# Patient Record
Sex: Female | Born: 1985 | Race: Black or African American | Hispanic: No | Marital: Single | Smoking: Former smoker
Health system: Southern US, Community
[De-identification: ages and names within clinical notes are randomized; demographics above are authoritative.]

## PROBLEM LIST (undated history)

## (undated) DIAGNOSIS — I509 Heart failure, unspecified: Secondary | ICD-10-CM

---
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
## 2009-07-10 ENCOUNTER — Emergency Department (HOSPITAL_COMMUNITY): Admission: EM | Admit: 2009-07-10 | Discharge: 2009-07-10 | Payer: Self-pay | Admitting: Family Medicine

## 2009-07-11 ENCOUNTER — Observation Stay (HOSPITAL_COMMUNITY): Admission: EM | Admit: 2009-07-11 | Discharge: 2009-07-12 | Payer: Self-pay | Admitting: Emergency Medicine

## 2009-07-11 ENCOUNTER — Encounter (INDEPENDENT_AMBULATORY_CARE_PROVIDER_SITE_OTHER): Payer: Self-pay | Admitting: Internal Medicine

## 2009-07-18 ENCOUNTER — Ambulatory Visit: Payer: Self-pay | Admitting: Internal Medicine

## 2009-07-18 DIAGNOSIS — J069 Acute upper respiratory infection, unspecified: Secondary | ICD-10-CM | POA: Insufficient documentation

## 2009-07-18 DIAGNOSIS — F17200 Nicotine dependence, unspecified, uncomplicated: Secondary | ICD-10-CM | POA: Insufficient documentation

## 2009-07-18 DIAGNOSIS — R9431 Abnormal electrocardiogram [ECG] [EKG]: Secondary | ICD-10-CM | POA: Insufficient documentation

## 2009-07-18 DIAGNOSIS — G4730 Sleep apnea, unspecified: Secondary | ICD-10-CM | POA: Insufficient documentation

## 2009-07-18 DIAGNOSIS — D649 Anemia, unspecified: Secondary | ICD-10-CM | POA: Insufficient documentation

## 2009-07-19 ENCOUNTER — Encounter (INDEPENDENT_AMBULATORY_CARE_PROVIDER_SITE_OTHER): Payer: Self-pay | Admitting: *Deleted

## 2009-07-29 ENCOUNTER — Ambulatory Visit: Payer: Self-pay | Admitting: Internal Medicine

## 2009-08-05 ENCOUNTER — Encounter: Payer: Self-pay | Admitting: Internal Medicine

## 2009-08-05 ENCOUNTER — Telehealth: Payer: Self-pay | Admitting: Internal Medicine

## 2009-08-26 ENCOUNTER — Encounter: Payer: Self-pay | Admitting: Internal Medicine

## 2010-03-02 ENCOUNTER — Emergency Department (HOSPITAL_COMMUNITY): Admission: EM | Admit: 2010-03-02 | Discharge: 2010-03-02 | Payer: Self-pay | Admitting: Family Medicine

## 2010-03-14 ENCOUNTER — Emergency Department (HOSPITAL_COMMUNITY): Admission: EM | Admit: 2010-03-14 | Discharge: 2010-03-14 | Payer: Self-pay | Admitting: Family Medicine

## 2010-06-10 NOTE — Letter (Signed)
Summary: Work note/[facility] [employer]  Work note/[facility] [employer]   Imported By: Lester [place] 08/29/2009 08:02:47  _____________________________________________________________________  External Attachment:    Type:   Image     Comment:   External Document

## 2010-06-10 NOTE — Assessment & Plan Note (Signed)
Summary: NEW/ [employer] /[employer]  #   Vital Signs:  Patient profile:   25 year old female Menstrual status:  regular LMP:     07/16/2009 Height:      69 inches Weight:      255 pounds BMI:     37.79 O2 Sat:      98 % on Room air Temp:     97.1 degrees F oral Pulse rate:   70 / minute Pulse rhythm:   regular Resp:     16 per minute BP sitting:   104 / 68  (left arm) Cuff size:   large  Vitals Entered By: Rock Nephew CMA (July 18, 2009 4:01 PM)  O2 Flow:  Room air  Primary Care Provider:  Etta Grandchild MD   History of Present Illness: New to me she needs a f/up on possible pericarditis. She developed URI symptoms one week ago and had some SOB so she was sent to the ER where her EKG had non-specific ST/T wave changes and the chest xray showed an enlarged cardiac silhouette. She is feeling much better but still has a mild cough.  She has iron-deficiency anemia from heavy periods.  Her bed partner complains of heavy snoring and apneic episodes.  Preventive Screening-Counseling & Management  Alcohol-Tobacco     Alcohol drinks/day: 1     Alcohol type: all     >5/day in last 3 mos: no     Alcohol Counseling: not indicated; use of alcohol is not excessive or problematic     Feels need to cut down: no     Feels annoyed by complaints: no     Feels guilty re: drinking: no     Needs 'eye opener' in am: no     Smoking Status: current     Smoking Cessation Counseling: yes     Smoke Cessation Stage: precontemplative  Caffeine-Diet-Exercise     Does Patient Exercise: no  Hep-HIV-STD-Contraception     Hepatitis Risk: no risk noted     HIV Risk: no risk noted     STD Risk: no risk noted      Sexual History:  currently monogamous.        Drug Use:  no.        Blood Transfusions:  no.    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   Resp:  Complains of cough, excessive snoring, hypersomnolence, and morning headaches; denies chest discomfort, chest pain with inspiration, coughing up blood, pleuritic, shortness of breath, sputum productive, and wheezing. Heme:  Denies abnormal bruising, bleeding, enlarge lymph nodes, fevers, pallor, and skin discoloration.  Physical Exam  General:  alert, well-developed, well-nourished, well-hydrated, appropriate dress, normal appearance, healthy-appearing, cooperative to examination, good hygiene, and overweight-appearing.   Head:  normocephalic, atraumatic, no abnormalities observed, and  no abnormalities palpated.   Eyes:  vision grossly intact, pupils equal, pupils round, and pupils reactive to light.   Ears:  R ear normal and L ear normal.   Nose:  External nasal examination shows no deformity or inflammation. Nasal mucosa are pink and moist without lesions or exudates. Mouth:  Oral mucosa and oropharynx without lesions or exudates.  Teeth in good repair. Neck:  supple, full ROM, no masses, no thyromegaly, no JVD, normal carotid upstroke, no carotid bruits, no cervical lymphadenopathy, and no neck tenderness.   Lungs:  normal respiratory effort, no intercostal retractions, no accessory muscle use, normal breath sounds, no dullness, no fremitus, no  crackles, and R wheezes.   Heart:  normal rate, regular rhythm, no murmur, no gallop, no rub, and no JVD.   Abdomen:  soft, non-tender, normal bowel sounds, no distention, no masses, no guarding, no rigidity, no rebound tenderness, no hepatomegaly, and no splenomegaly.   Msk:  No deformity or scoliosis noted of thoracic or lumbar spine.   Pulses:  R and L carotid,radial,femoral,dorsalis pedis and posterior tibial pulses are full and equal bilaterally Extremities:  No clubbing, cyanosis, edema, or deformity noted with normal full range of motion of all joints.   Neurologic:  No cranial nerve deficits noted. Station and gait are normal. Plantar reflexes are down-going bilaterally. DTRs are symmetrical throughout. Sensory, motor and coordinative functions appear intact. Skin:  Intact without suspicious lesions or rashes Cervical Nodes:  no anterior cervical adenopathy and no posterior cervical adenopathy.   Axillary Nodes:  no R axillary adenopathy and no L axillary adenopathy.   Psych:  Cognition and judgment appear intact. Alert and cooperative with normal attention span and concentration. No apparent delusions, illusions, hallucinations Additional Exam:  EKG shows sinus bradycardia with normal pr segments and normal st/t waves   Impression & Recommendations:  Problem # 1:  ANEMIA-NOS (ICD-285.9)  Problem # 2:  TOBACCO USE (ICD-305.1) Assessment: Unchanged  Encouraged smoking cessation and discussed different methods for smoking cessation.   Problem # 3:  URI (ICD-465.9) Assessment: New it sounds like she has an inflammatory component so will treat with systemic steroids Orders: Admin of Therapeutic Inj  intramuscular or subcutaneous (16109) Depo- Medrol 40mg  (J1030) Depo- Medrol 80mg  (J1040)  Problem # 4:  SLEEP APNEA (ICD-780.57) Assessment: New  Orders: Sleep Disorder Referral (Sleep Disorder)  Problem # 5:  ELECTROCARDIOGRAM, ABNORMAL (ICD-794.31) Assessment:  Improved  Orders: EKG w/ Interpretation (93000)  Patient Instructions: 1)  Please schedule a follow-up appointment in 1 month. 2)  Get plenty of rest, drink lots of clear liquids, and use Tylenol or Ibuprofen for fever and comfort. Return in 7-10 days if you're not better:sooner if you're feeling worse. 3)  Tobacco is very bad for your health and your loved ones! You Should stop smoking!. 4)  Stop Smoking Tips: Choose a Quit date. Cut down before the Quit date. decide what you will do as a substitute when you feel the urge to smoke(gum,toothpick,exercise). 5)  It is important that you exercise regularly at least 20 minutes 5 times a week. If you develop chest pain, have severe difficulty breathing, or feel very tired , stop exercising immediately and seek medical attention. 6)  You need to lose weight. Consider a lower calorie diet and regular exercise.    Medication Administration  Injection # 1:    Medication: Depo- Medrol 80mg     Diagnosis: URI (ICD-465.9)    Route: IM    Site: R deltoid  Exp Date: 01/2012    Lot #: Franchot Heidelberg    Mfr: pfizer    Patient tolerated injection without complications    Given by: Rock Nephew CMA (July 18, 2009 4:02 PM)  Injection # 2:    Medication: Depo- Medrol 40mg     Diagnosis: URI (ICD-465.9)    Route: IM    Site: R deltoid    Exp Date: 01/2012    Lot #: Franchot Heidelberg    Mfr: pfizer    Patient tolerated injection without complications    Given by: Rock Nephew CMA (July 18, 2009 4:02 PM)  Orders Added: 1)  Admin of Therapeutic Inj  intramuscular or subcutaneous [96372] 2)  Depo- Medrol 40mg  [J1030] 3)  Depo- Medrol 80mg  [J1040] 4)  EKG w/ Interpretation [93000] 5)  Sleep Disorder Referral [Sleep Disorder] 6)  New Patient Level IV [04540]

## 2010-06-10 NOTE — Progress Notes (Signed)
Summary: WORK NOTE  Phone Note From Other Clinic   Caller: Bjorn Loser [phone] Summary of Call: [facility] services is requiring a note stating pt is able to return w/o restrictions on March 14th. FAX [phone] ATTN Rhonda Purvis - HR coordinator. Initial call taken by: Lamar Sprinkles, CMA,  August 05, 2009 11:41 AM  Follow-up for Phone Call        ok Follow-up by: Etta Grandchild MD,  August 05, 2009 11:45 AM  Additional Follow-up for Phone Call Additional follow up Details #1::        letter faxed to the provided number Attn Rhonda Additional Follow-up by: Rock Nephew CMA,  August 05, 2009 3:13 PM

## 2010-06-10 NOTE — Letter (Signed)
Summary: [facility] Consult Scheduled Letter  [facility]  [address] [place], [place] [address]   Phone: [phone]  Fax: [phone]      07/19/2009 MRN: [id number]  Traci Livingston [address] [place], [place]  [address]    Dear [name],      We have scheduled an appointment for you. At the recommendation of Dr.Jones,we have scheduled you a consult with ([facility])Dr.Alva on Friday April 1,2011 at 10:00Am- Arrival 9:45am .Their phone number is [phone]. If this appointment day and time is not convenient for you, please feel free to call the office of the doctor you are being referred to at the number listed above and reschedule the appointment.  [facility] 2nd floor [address] [place] [address]    **Please give 24Hr notice if you need to cancel/Reschedule to avoid a $50.00 fee. also [employer] card  and any co-pay due at time of visit ****  Thank you,  Patient Care Coordinator [facility]

## 2010-06-10 NOTE — Letter (Signed)
Summary: Out of Work  [facility]  [address] [place], [place] [address]   Phone: [phone]  Fax: [phone]    August 05, 2009   Employee:  [employer] [place]    To Whom It May Concern:   The above patient is permitted to return to work with no restrictions on July 22, 2009.    If you need additional information, please feel free to contact our office.         Sincerely,    Etta Grandchild MD

## 2010-08-03 LAB — POCT I-STAT, CHEM 8
BUN: 10 mg/dL (ref 6–23)
Chloride: 104 mEq/L (ref 96–112)
Creatinine, Ser: 1.1 mg/dL (ref 0.4–1.2)
Glucose, Bld: 99 mg/dL (ref 70–99)
Sodium: 136 mEq/L (ref 135–145)
TCO2: 23 mmol/L (ref 0–100)

## 2010-08-03 LAB — URINALYSIS, ROUTINE W REFLEX MICROSCOPIC
Bilirubin Urine: NEGATIVE
Glucose, UA: NEGATIVE mg/dL
Hgb urine dipstick: NEGATIVE
Ketones, ur: NEGATIVE mg/dL
Nitrite: NEGATIVE
Urobilinogen, UA: 1 mg/dL (ref 0.0–1.0)

## 2010-08-03 LAB — CBC
MCHC: 33.5 g/dL (ref 30.0–36.0)
MCHC: 33.8 g/dL (ref 30.0–36.0)
MCV: 75.9 fL — ABNORMAL LOW (ref 78.0–100.0)
MCV: 76.4 fL — ABNORMAL LOW (ref 78.0–100.0)
Platelets: 206 10*3/uL (ref 150–400)
Platelets: 224 10*3/uL (ref 150–400)
RBC: 4.37 MIL/uL (ref 3.87–5.11)
RDW: 14.6 % (ref 11.5–15.5)
RDW: 14.8 % (ref 11.5–15.5)

## 2010-08-03 LAB — FERRITIN: Ferritin: 29 ng/mL (ref 10–291)

## 2010-08-03 LAB — DIFFERENTIAL
Lymphs Abs: 0.4 10*3/uL — ABNORMAL LOW (ref 0.7–4.0)
Monocytes Absolute: 0.7 10*3/uL (ref 0.1–1.0)
Monocytes Relative: 10 % (ref 3–12)
Neutro Abs: 5.7 10*3/uL (ref 1.7–7.7)

## 2010-08-03 LAB — CULTURE, BLOOD (ROUTINE X 2): Culture: NO GROWTH

## 2010-08-03 LAB — COMPREHENSIVE METABOLIC PANEL
ALT: 20 U/L (ref 0–35)
Albumin: 3.1 g/dL — ABNORMAL LOW (ref 3.5–5.2)
Alkaline Phosphatase: 69 U/L (ref 39–117)
Chloride: 105 mEq/L (ref 96–112)
Potassium: 3.6 mEq/L (ref 3.5–5.1)
Sodium: 133 mEq/L — ABNORMAL LOW (ref 135–145)
Total Protein: 6.7 g/dL (ref 6.0–8.3)

## 2010-08-03 LAB — BASIC METABOLIC PANEL
BUN: 9 mg/dL (ref 6–23)
CO2: 27 mEq/L (ref 19–32)
Chloride: 107 mEq/L (ref 96–112)
GFR calc Af Amer: >60 mL/min (ref >60)
Glucose, Bld: 92 mg/dL (ref 70–99)
Potassium: 3.5 mEq/L (ref 3.5–5.1)
Sodium: 140 mEq/L (ref 135–145)

## 2010-08-03 LAB — RETICULOCYTES
RBC.: 4.66 MIL/uL (ref 3.87–5.11)
Retic Count, Absolute: 55.9 10*3/uL (ref 19.0–186.0)

## 2010-08-03 LAB — CARDIAC PANEL(CRET KIN+CKTOT+MB+TROPI)
Relative Index: 0.4 (ref 0.0–2.5)
Total CK: 196 U/L — ABNORMAL HIGH (ref 7–177)
Troponin I: 0.01 ng/mL (ref 0.00–0.06)

## 2010-08-03 LAB — TSH: TSH: 0.199 u[IU]/mL — ABNORMAL LOW (ref 0.350–4.500)

## 2010-08-03 LAB — CK TOTAL AND CKMB (NOT AT ARMC)
CK, MB: 1 ng/mL (ref 0.3–4.0)
Relative Index: 0.5 (ref 0.0–2.5)

## 2010-08-03 LAB — LIPID PANEL
LDL Cholesterol: 95 mg/dL (ref 0–99)
Triglycerides: 37 mg/dL (ref <150)

## 2010-08-03 LAB — FOLATE: Folate: 15.1 ng/mL

## 2010-08-03 LAB — BRAIN NATRIURETIC PEPTIDE: Pro B Natriuretic peptide (BNP): <30 pg/mL (ref 0.0–100.0)

## 2010-08-03 LAB — POCT CARDIAC MARKERS: Myoglobin, poc: 62.3 ng/mL (ref 12–200)

## 2010-08-03 LAB — TROPONIN I: Troponin I: 0.02 ng/mL (ref 0.00–0.06)

## 2010-08-03 LAB — IRON AND TIBC
Iron: 14 ug/dL — ABNORMAL LOW (ref 42–135)
Saturation Ratios: 4 % — ABNORMAL LOW (ref 20–55)

## 2010-08-03 LAB — C-REACTIVE PROTEIN: CRP: 5.4 mg/dL — ABNORMAL HIGH (ref <0.6)

## 2010-08-03 LAB — URINE MICROSCOPIC-ADD ON

## 2010-08-31 ENCOUNTER — Emergency Department (HOSPITAL_COMMUNITY)
Admission: EM | Admit: 2010-08-31 | Discharge: 2010-08-31 | Disposition: A | Discharge status: Home / Self Care | Payer: No Typology Code available for payment source | Attending: Emergency Medicine | Admitting: Emergency Medicine

## 2010-08-31 DIAGNOSIS — M542 Cervicalgia: Secondary | ICD-10-CM | POA: Insufficient documentation

## 2010-08-31 DIAGNOSIS — M25519 Pain in unspecified shoulder: Secondary | ICD-10-CM | POA: Insufficient documentation

## 2010-08-31 DIAGNOSIS — IMO0001 Reserved for inherently not codable concepts without codable children: Secondary | ICD-10-CM | POA: Insufficient documentation

## 2010-08-31 DIAGNOSIS — M546 Pain in thoracic spine: Secondary | ICD-10-CM | POA: Insufficient documentation

## 2010-09-26 NOTE — Op Note (Signed)
NAME:  Traci Livingston, Traci Livingston              ACCOUNT NO.:  [id number]   MEDICAL RECORD NO.:  [id number]          PATIENT TYPE:  AMB   LOCATION:  [facility]                          FACILITY:  [facility]   PHYSICIAN:  Gerald L. 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   PROCEDURE:  Bilateral breast reductions using the inferior pedicle  technique.   ANESTHESIA:  General.   DESCRIPTION OF PROCEDURE:  Preoperatively, the patient was set up and drawn  for the inferior pedicle reduction mammoplasty, remarking the nipple areolar  complexes from over 34 cm back up to 20 cm from the suprasternal notch.  She  then underwent general anesthesia intubated orally.  Prep was done to the  chest and breast areas in routine fashion using Betadine soap and solution  and walled off with sterile towels and drapes so as to make a sterile field.  The areas were injected locally, the parenchyma with 0.25% Xylocaine with  epinephrine 1:400,000 concentration, a total of 150 mL per side.  The wounds  were scored with #15 blades and then the skin over the inferior pedicle was  deepithelialized with #20 blades.  Medial and lateral fatty dermal pedicles  were excised down to underlying fascia of the pectoralis major.  Laterally  more accessory breast tissue was removed until the axillary regions as well  as the serratus anterior to the latissimus dorsi areas.  After proper  hemostasis, new areas of the keyhole was debulked.  After this the flaps  were transposed and stayed with 3-0 Prolene.  Subcutaneous closure was done  with 3-0 Monocryl x2 layers and then running subcuticular stitches of 3-0  Monocryl and 5-0 Monocryl throughout the inverted T.  We  performed the same  procedure on both sides.  The right side was slightly larger.  The wounds  were cleansed.  Steri-Strips and soft dressings were applied to all of the  areas including Xeroform, 4x4's, ABD's, and Hypafix tape.  At the end of the  procedure, the nipple areolar complexes were examined with good suppleness  and good blood supply.  She was then taken to the recovery room in excellent  condition.  Estimated blood loss was less than 100 mL.  Complications were  none.      Yaakov Guthrie. Shon Hough, M.D.  Electronically Signed     GLT/MEDQ  D:  01/05/2005  T:  01/05/2005  Job:  [id number]

## 2010-10-07 ENCOUNTER — Inpatient Hospital Stay (INDEPENDENT_AMBULATORY_CARE_PROVIDER_SITE_OTHER)
Admission: RE | Admit: 2010-10-07 | Discharge: 2010-10-07 | Disposition: A | Discharge status: Home / Self Care | Payer: No Typology Code available for payment source | Source: Ambulatory Visit | Attending: Family Medicine | Admitting: Family Medicine

## 2010-10-07 DIAGNOSIS — M67919 Unspecified disorder of synovium and tendon, unspecified shoulder: Secondary | ICD-10-CM

## 2010-12-23 ENCOUNTER — Inpatient Hospital Stay (INDEPENDENT_AMBULATORY_CARE_PROVIDER_SITE_OTHER)
Admission: RE | Admit: 2010-12-23 | Discharge: 2010-12-23 | Disposition: A | Discharge status: Home / Self Care | Payer: BC Managed Care – PPO | Source: Ambulatory Visit | Attending: Emergency Medicine | Admitting: Emergency Medicine

## 2010-12-23 DIAGNOSIS — K5289 Other specified noninfective gastroenteritis and colitis: Secondary | ICD-10-CM

## 2010-12-23 DIAGNOSIS — R112 Nausea with vomiting, unspecified: Secondary | ICD-10-CM

## 2010-12-23 DIAGNOSIS — R197 Diarrhea, unspecified: Secondary | ICD-10-CM

## 2012-03-09 ENCOUNTER — Emergency Department (HOSPITAL_COMMUNITY)
Admission: EM | Admit: 2012-03-09 | Discharge: 2012-03-09 | Disposition: A | Discharge status: Home / Self Care | Payer: Self-pay | Attending: Emergency Medicine | Admitting: Emergency Medicine

## 2012-03-09 ENCOUNTER — Emergency Department (HOSPITAL_COMMUNITY): Payer: Self-pay

## 2012-03-09 ENCOUNTER — Encounter (HOSPITAL_COMMUNITY): Payer: Self-pay | Admitting: Emergency Medicine

## 2012-03-09 DIAGNOSIS — R079 Chest pain, unspecified: Secondary | ICD-10-CM

## 2012-03-09 DIAGNOSIS — R0789 Other chest pain: Secondary | ICD-10-CM | POA: Insufficient documentation

## 2012-03-09 DIAGNOSIS — F17200 Nicotine dependence, unspecified, uncomplicated: Secondary | ICD-10-CM | POA: Insufficient documentation

## 2012-03-09 DIAGNOSIS — I509 Heart failure, unspecified: Secondary | ICD-10-CM | POA: Insufficient documentation

## 2012-03-09 HISTORY — DX: Heart failure, unspecified: I50.9

## 2012-03-09 LAB — CBC WITH DIFFERENTIAL/PLATELET
Basophils Relative: 0 % (ref 0–1)
Eosinophils Absolute: 0.2 10*3/uL (ref 0.0–0.7)
HCT: 35.8 % — ABNORMAL LOW (ref 36.0–46.0)
Hemoglobin: 11.4 g/dL — ABNORMAL LOW (ref 12.0–15.0)
MCH: 24.7 pg — ABNORMAL LOW (ref 26.0–34.0)
MCHC: 31.8 g/dL (ref 30.0–36.0)
MCV: 77.5 fL — ABNORMAL LOW (ref 78.0–100.0)
Monocytes Absolute: 0.6 10*3/uL (ref 0.1–1.0)
Monocytes Relative: 6 % (ref 3–12)
Neutro Abs: 7.2 10*3/uL (ref 1.7–7.7)

## 2012-03-09 LAB — BASIC METABOLIC PANEL
BUN: 12 mg/dL (ref 6–23)
Chloride: 101 mEq/L (ref 96–112)
Creatinine, Ser: 0.92 mg/dL (ref 0.50–1.10)
GFR calc Af Amer: >90 mL/min (ref >90)

## 2012-03-09 NOTE — ED Provider Notes (Signed)
Medical screening examination/treatment/procedure(s) were performed by non-physician practitioner and as supervising physician I was immediately available for consultation/collaboration.  Keenya Matera R. Chelsie Burel, MD 03/09/12 1643 

## 2012-03-09 NOTE — ED Notes (Signed)
Pt reports sharp, non-radiating pain in center of chest that began yesterday upon awakening.  Denies n/v, sob, diaphoresis.

## 2012-03-09 NOTE — ED Provider Notes (Signed)
History     CSN: [id number]  Arrival date & time 03/09/12  5621   First MD Initiated Contact with Patient 03/09/12 [phone]      Chief Complaint  Patient presents with  . Chest Pain    (Consider location/radiation/quality/duration/timing/severity/associated sxs/prior treatment) HPI  Traci Livingston is a 26 y.o. female complaining of sharp, sternal chest pain, nonpleuritic x 24 hours. Patient denies any trauma, shortness of breath, fever, cough, recent travel/immobiliaztion, leg swelling, exogenous estrogen, FH of early cardiac death. Patient has episode of what she describes as fluid around her heart several years ago wants to come in and get it checked out. She denies any recent viral illnesses. States that this episode does not feel like the prior one.  Echo in 07/2009 showed no abnormalities  Past Medical History  Diagnosis Date  . CHF (congestive heart failure)     "fluid around my heart"    History reviewed. No pertinent past surgical history.  History reviewed. No pertinent family history.  History  Substance Use Topics  . Smoking status: Current Every Day Smoker -- 1.0 packs/day    Types: Cigars  . Smokeless tobacco: Never Used  . Alcohol Use: Yes     occassionally    OB History    Grav Para Term Preterm Abortions TAB SAB Ect Mult Living                  Review of Systems  Constitutional: Negative for fever.  Respiratory: Negative for shortness of breath.   Cardiovascular: Positive for chest pain. Negative for palpitations and leg swelling.  Gastrointestinal: Negative for nausea, vomiting, abdominal pain and diarrhea.  All other systems reviewed and are negative.    Allergies  Review of patient's allergies indicates no known allergies.  Home Medications  No current outpatient prescriptions on file.  BP 135/71  Pulse 68  Temp 98.7 F (37.1 C) (Oral)  Resp 16  Ht 5\' 9"  (1.753 m)  Wt 283 lb (128.368 kg)  BMI 41.79 kg/m2  SpO2 100%  LMP  02/24/2012  Physical Exam  Nursing note and vitals reviewed. Constitutional: She is oriented to person, place, and time. She appears well-developed and well-nourished. No distress.  HENT:  Head: Normocephalic.  Eyes: Conjunctivae normal and EOM are normal.  Cardiovascular: Normal rate, regular rhythm, normal heart sounds and intact distal pulses.  Exam reveals no gallop and no friction rub.   No murmur heard. Pulmonary/Chest: Effort normal and breath sounds normal. No stridor. No respiratory distress. She has no wheezes. She has no rales. She exhibits tenderness.       Chest pain reproducible to palpation of sternum  Abdominal: Soft. Bowel sounds are normal. She exhibits no distension and no mass. There is no tenderness. There is no rebound and no guarding.  Musculoskeletal: Normal range of motion.  Neurological: She is alert and oriented to person, place, and time.  Psychiatric: She has a normal mood and affect.    ED Course  Procedures (including critical care time)  Labs Reviewed  CBC WITH DIFFERENTIAL - Abnormal; Notable for the following:    WBC 11.5 (*)     Hemoglobin 11.4 (*)     HCT 35.8 (*)     MCV 77.5 (*)     MCH 24.7 (*)     All other components within normal limits  BASIC METABOLIC PANEL - Abnormal; Notable for the following:    Sodium 133 (*)     GFR calc non Af Amer 86 (*)  All other components within normal limits   Dg Chest 2 View  03/09/2012  *RADIOLOGY REPORT*  Clinical Data: Chest pain.  CHEST - 2 VIEW  Comparison: 07/10/2009  Findings: Heart is upper limits normal in size.  Lungs are clear. No effusions.  No acute bony abnormality.  IMPRESSION: No active cardiopulmonary disease.   Original Report Authenticated By: Cyndie Chime, M.D.      Date: 03/09/2012  Rate:79  Rhythm: normal sinus rhythm  QRS Axis: normal  Intervals: normal  ST/T Wave abnormalities: nonspecific ST/T changes  Conduction Disutrbances:none  Narrative Interpretation:   Old EKG  Reviewed: unchanged  1. Chest pain       MDM  EKG is unchanged and chest x-ray shows no abnormalities or effusions. Patient is low risk by Wells criteria and PERC negative. Blood work is unremarkable. I doubt any acute process at this time. However considering her history spent considerable time with discharge precautions and red flags to return to the emergency room. Patient voiced her understanding and repeated return precautions back to me.          Wynetta Emery, PA-C 03/09/12 [phone]

## 2012-03-09 NOTE — ED Notes (Signed)
Pt returned from radiology at this time.  

## 2014-03-07 IMAGING — CR DG CHEST 2V
2 series · 2 of 2 positions shown · non-contrast
Comparison: 07/10/2009

CLINICAL DATA: Chest pain.

CHEST - 2 VIEW

[w chest pa]
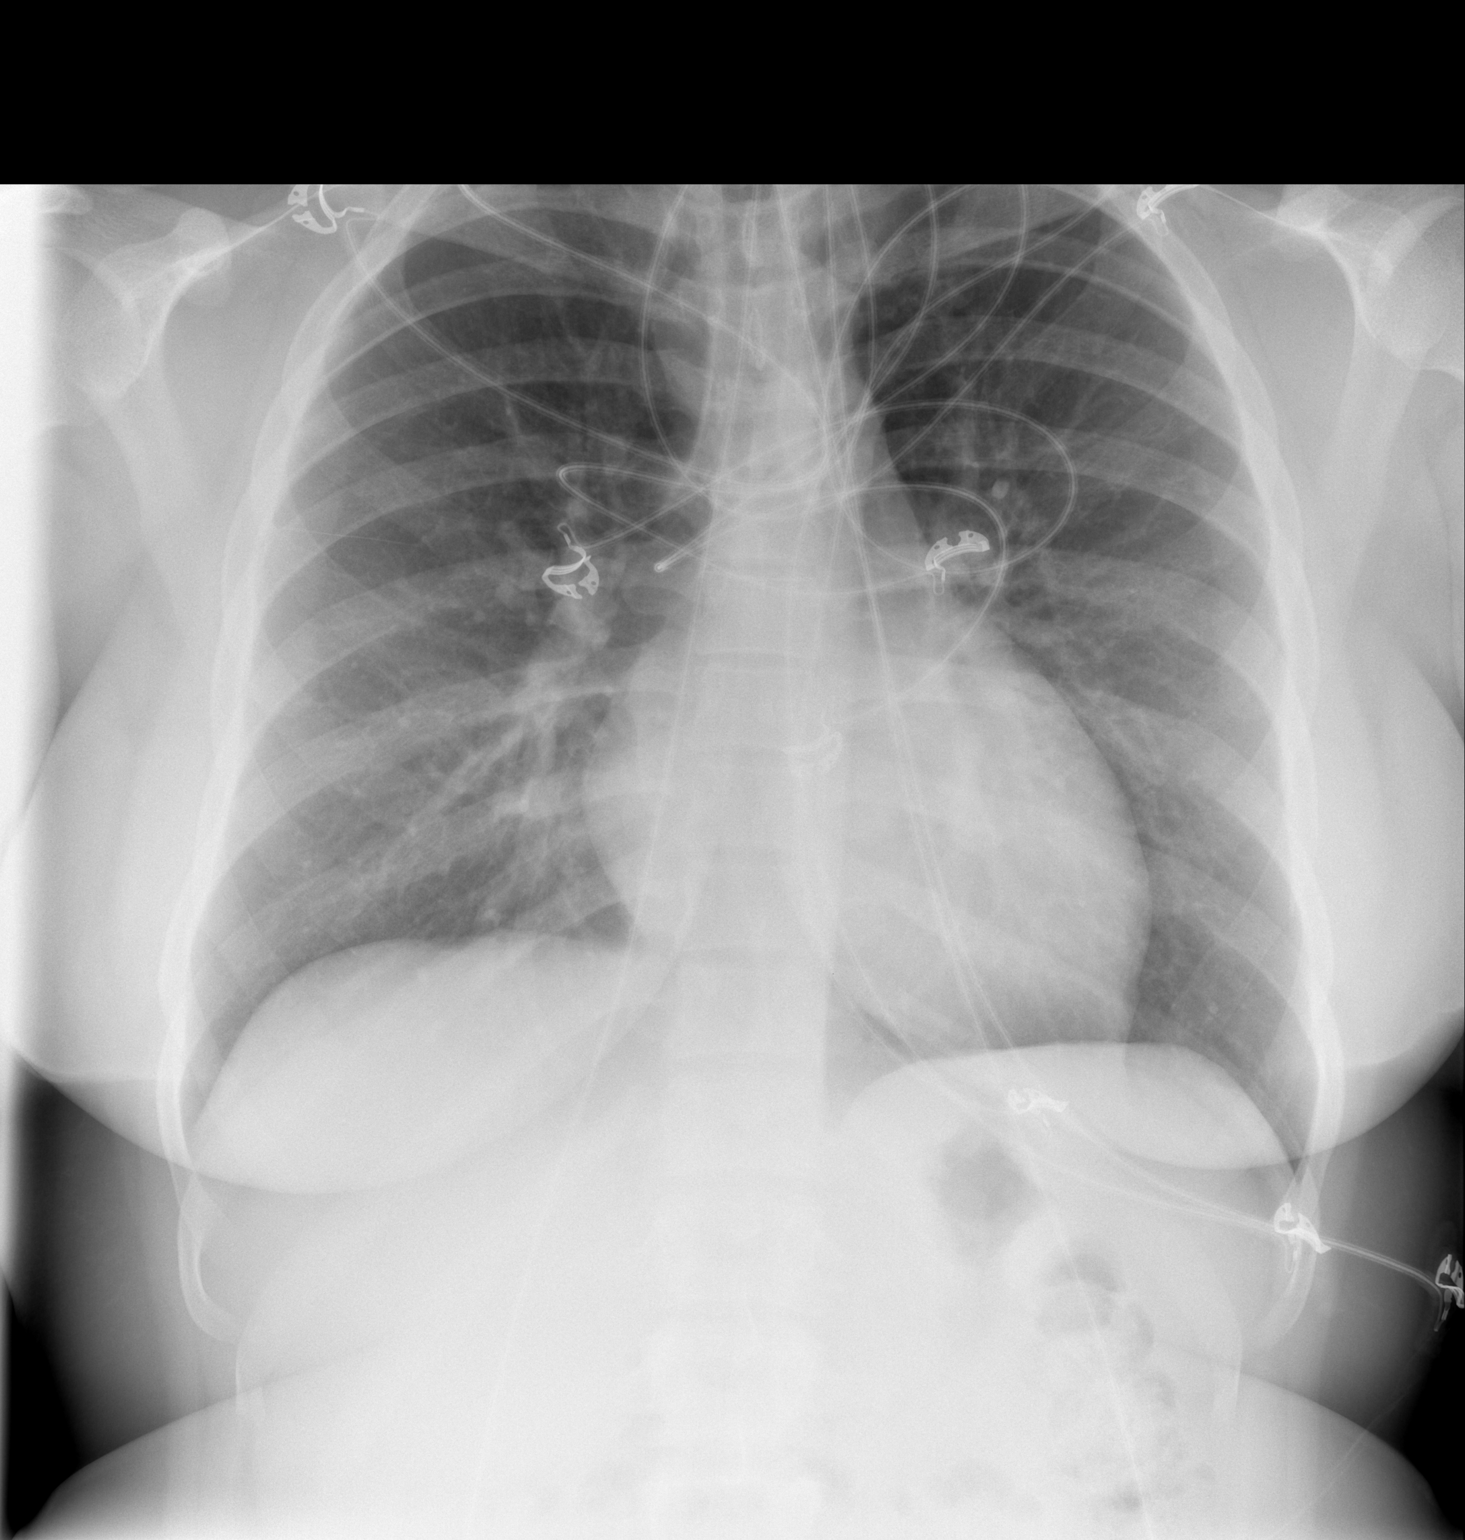

[w chest lat]
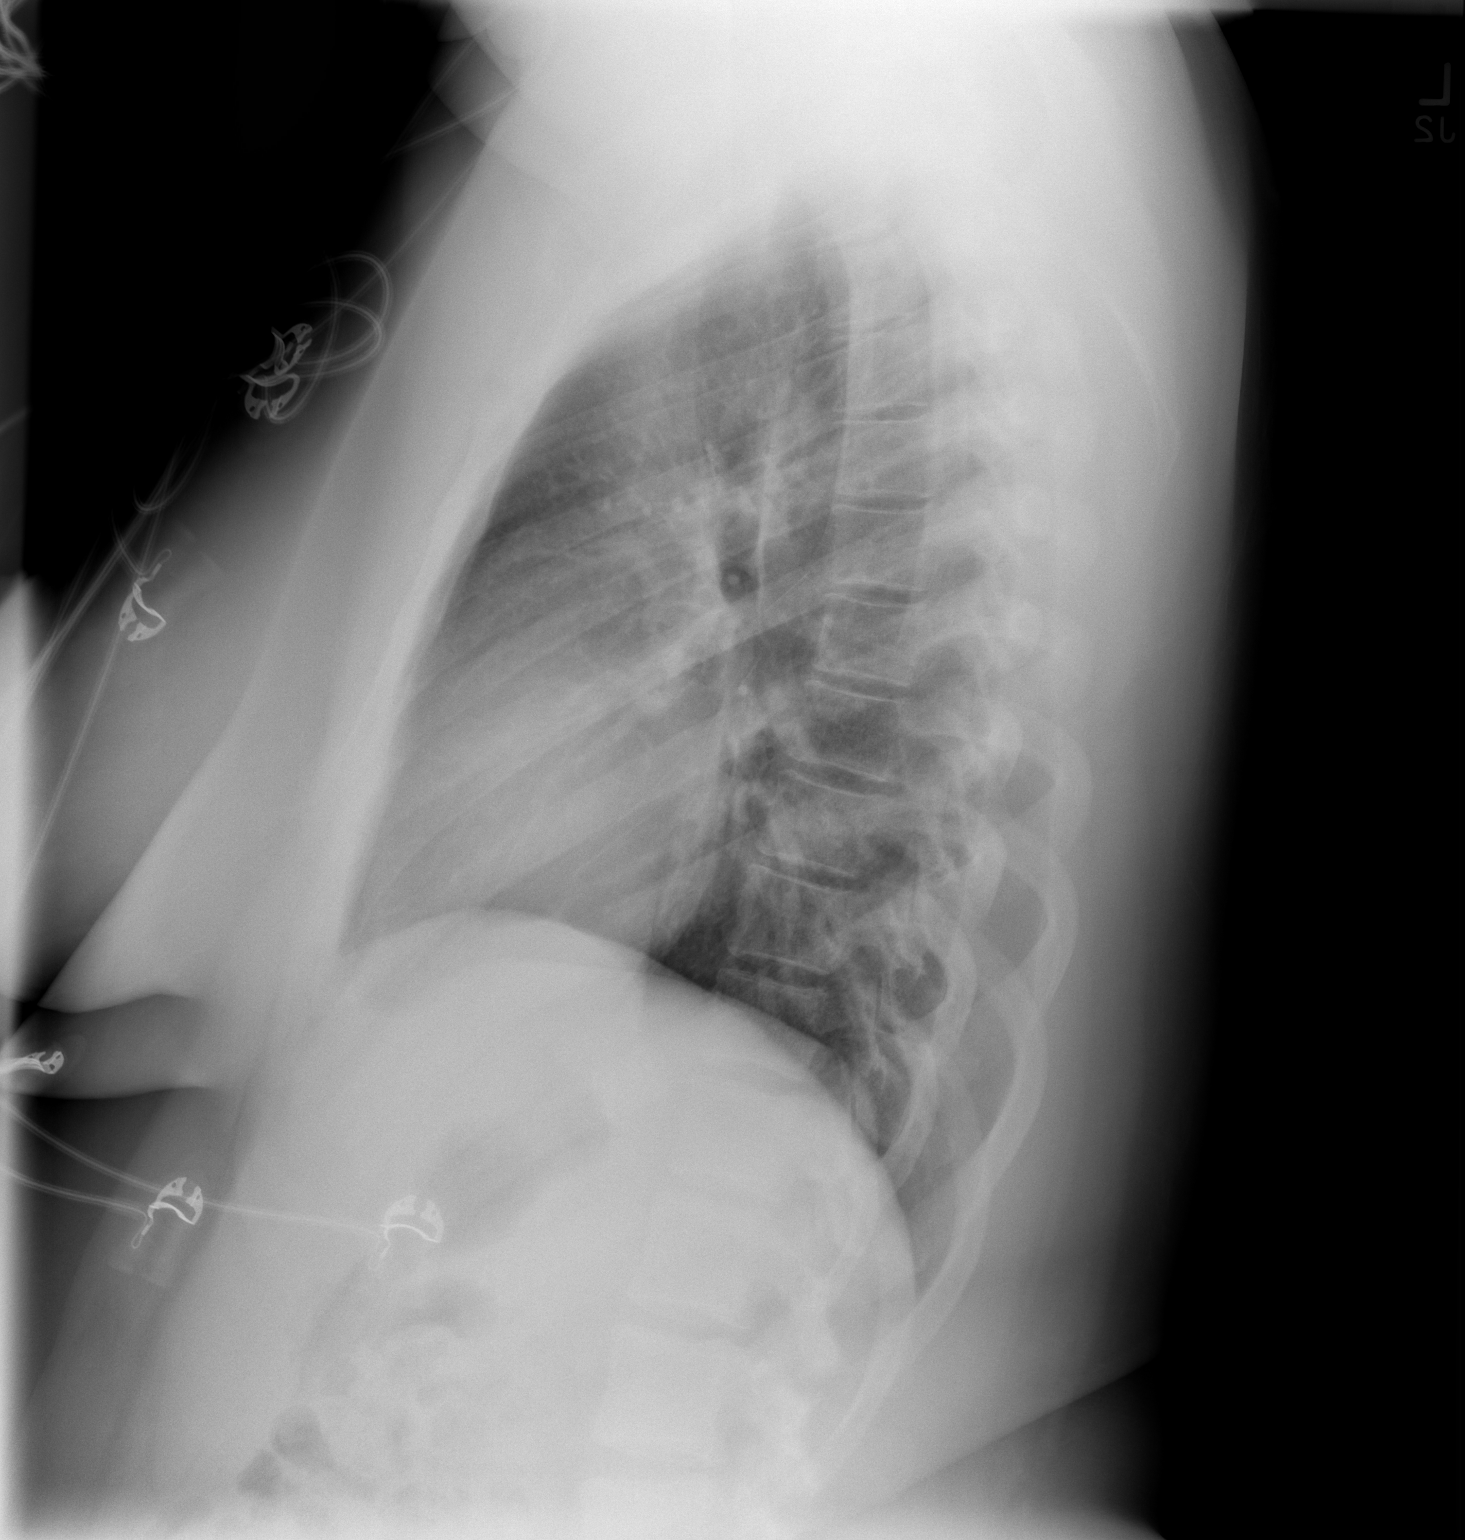

[2 of 2 positions shown; findings below may reference images not displayed]

FINDINGS: Heart is upper limits normal in size.  Lungs are clear.
No effusions.  No acute bony abnormality.
IMPRESSION: No active cardiopulmonary disease.

## 2019-01-09 ENCOUNTER — Encounter (HOSPITAL_COMMUNITY): Payer: Self-pay | Admitting: Emergency Medicine

## 2019-01-09 ENCOUNTER — Other Ambulatory Visit: Payer: Self-pay

## 2019-01-09 ENCOUNTER — Ambulatory Visit (HOSPITAL_COMMUNITY)
Admission: EM | Admit: 2019-01-09 | Discharge: 2019-01-09 | Disposition: A | Discharge status: Home / Self Care | Payer: 59 | Attending: Family Medicine | Admitting: Family Medicine

## 2019-01-09 DIAGNOSIS — Z20828 Contact with and (suspected) exposure to other viral communicable diseases: Secondary | ICD-10-CM | POA: Diagnosis not present

## 2019-01-09 DIAGNOSIS — Z792 Long term (current) use of antibiotics: Secondary | ICD-10-CM | POA: Insufficient documentation

## 2019-01-09 DIAGNOSIS — D649 Anemia, unspecified: Secondary | ICD-10-CM | POA: Diagnosis not present

## 2019-01-09 DIAGNOSIS — J36 Peritonsillar abscess: Secondary | ICD-10-CM | POA: Diagnosis not present

## 2019-01-09 DIAGNOSIS — F17290 Nicotine dependence, other tobacco product, uncomplicated: Secondary | ICD-10-CM | POA: Diagnosis not present

## 2019-01-09 DIAGNOSIS — G4730 Sleep apnea, unspecified: Secondary | ICD-10-CM | POA: Diagnosis not present

## 2019-01-09 DIAGNOSIS — J029 Acute pharyngitis, unspecified: Secondary | ICD-10-CM | POA: Diagnosis present

## 2019-01-09 DIAGNOSIS — Z79899 Other long term (current) drug therapy: Secondary | ICD-10-CM | POA: Insufficient documentation

## 2019-01-09 DIAGNOSIS — I509 Heart failure, unspecified: Secondary | ICD-10-CM | POA: Diagnosis not present

## 2019-01-09 DIAGNOSIS — U071 COVID-19: Secondary | ICD-10-CM | POA: Diagnosis not present

## 2019-01-09 LAB — POCT RAPID STREP A: Streptococcus, Group A Screen (Direct): NEGATIVE

## 2019-01-09 MED ORDER — AMOXICILLIN-POT CLAVULANATE 875-125 MG PO TABS: 1.0000 | ORAL_TABLET | Freq: Two times a day (BID) | ORAL | 0 refills | Status: DC

## 2019-01-09 NOTE — ED Triage Notes (Signed)
Pt here for sore throat with swelling in tonsils

## 2019-01-09 NOTE — Discharge Instructions (Addendum)
I am treating you with antibiotics because I fear a tonsil abscess I recommend taking a probiotic with augmentin The rapid strep test is negative The covid test will be available in 2-3 days You can get your results on My Chart Rest Push fluids You must quarantine until the test results are all back Call for questions or problems

## 2019-01-09 NOTE — ED Provider Notes (Signed)
[facility]    CSN: [id number] Arrival date & time: 01/09/19  1539      History   Chief Complaint Chief Complaint  Patient presents with  . Sore Throat    HPI Traci Livingston is a 33 y.o. female.   HPI  Patient states that her tonsils have felt swollen for couple of days.  A little bit sore.  Low but scratchy.  Not really painful.  Today she notes they were little bit more sore and she had some runny nose.  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  No fever or chills.  No body aches.  No coughing or shortness of breath.  No rash.  No change in taste or smell  Past Medical History:  Diagnosis Date  . CHF (congestive heart failure) (HCC)    "fluid around my heart"    Patient Active Problem List   Diagnosis Date Noted  . ANEMIA-NOS 07/18/2009  . TOBACCO USE 07/18/2009  . URI 07/18/2009  . SLEEP APNEA 07/18/2009  . ELECTROCARDIOGRAM, ABNORMAL 07/18/2009    History reviewed. No pertinent surgical history.  OB History   No obstetric history on file.      Home Medications    Prior to Admission medications   Medication Sig Start Date End Date Taking? Authorizing Provider  amoxicillin-clavulanate (AUGMENTIN) 875-125 MG tablet Take 1 tablet by mouth every 12 (twelve) hours. 01/09/19   Eustace MooreNelson, [name], MD  ibuprofen (ADVIL,MOTRIN) 200 MG tablet Take 400-600 mg by mouth every 6 (six) hours as needed. For pain    [provider]  Iron TABS Take 1 tablet by mouth daily.    [provider]    Family History History reviewed. No pertinent family history.  Social History Social History   Tobacco Use  . Smoking status: Current Every Day Smoker    Packs/day: 1.00    Types: Cigars  . Smokeless tobacco: Never Used  Substance Use Topics  . Alcohol use: Yes    Comment: occassionally  . Drug use: No     Allergies   Patient has no known allergies.   Review of Systems Review of  Systems  Constitutional: Negative for chills and fever.  HENT: Positive for sore throat. Negative for ear pain.   Eyes: Negative for pain and visual disturbance.  Respiratory: Negative for cough and shortness of breath.   Cardiovascular: Negative for chest pain and palpitations.  Gastrointestinal: Negative for abdominal pain and vomiting.  Genitourinary: Negative for dysuria and hematuria.  Musculoskeletal: Negative for arthralgias and back pain.  Skin: Negative for color change and rash.  Neurological: Negative for seizures and syncope.  All other systems reviewed and are negative.    Physical Exam Triage Vital Signs ED Triage Vitals  Enc Vitals Group     BP 01/09/19 1618 (!) 139/97     Pulse Rate 01/09/19 1618 74     Resp 01/09/19 1618 18     Temp 01/09/19 1618 97.8 F (36.6 C)     Temp Source 01/09/19 1618 Oral     SpO2 01/09/19 1618 99 %     Weight --      Height --      Head Circumference --      Peak Flow --      Pain Score 01/09/19 1619 5     Pain Loc --      Pain Edu? --  Excl. in GC? --    No data found.  Updated Vital Signs BP (!) 139/97 (BP Location: Left Arm)   Pulse 74   Temp 97.8 F (36.6 C) (Oral)   Resp 18   SpO2 99%      Physical Exam Constitutional:      General: She is not in acute distress.    Appearance: She is well-developed.  HENT:     Head: Normocephalic and atraumatic.     Mouth/Throat:   Eyes:     Conjunctiva/sclera: Conjunctivae normal.     Pupils: Pupils are equal, round, and reactive to light.  Neck:     Musculoskeletal: Normal range of motion.  Cardiovascular:     Rate and Rhythm: Normal rate.  Pulmonary:     Effort: Pulmonary effort is normal. No respiratory distress.  Abdominal:     General: There is no distension.     Palpations: Abdomen is soft.  Musculoskeletal: Normal range of motion.  Skin:    General: Skin is warm and dry.  Neurological:     Mental Status: She is alert.      UC Treatments / Results   Labs (all labs ordered are listed, but only abnormal results are displayed) Labs Reviewed  NOVEL CORONAVIRUS, NAA (HOSP ORDER, SEND-OUT TO REF LAB; TAT 18-24 HRS)  CULTURE, GROUP A STREP [facility])  POCT RAPID STREP A    EKG   Radiology No results found.  Procedures Procedures (including critical care time)  Medications Ordered in UC Medications - No data to display  Initial Impression / Assessment and Plan / UC Course  I have reviewed the triage vital signs and the nursing notes.  Pertinent labs & imaging results that were available during my care of the patient were reviewed by me and considered in my medical decision making (see chart for details).     With the tonsillar asymmetry I have concerned about abscess.  It is unusual to consider an abscess with so little pain, and fever, but the appearance is clearly abnormal.  I am going to put her on antibiotics.  Strep culture is sent to laboratory.  Coronavirus testing is accomplished.  Patient is educated Final Clinical Impressions(s) / UC Diagnoses   Final diagnoses:  None     Discharge Instructions     I am treating you with antibiotics because I fear a tonsil abscess I recommend taking a probiotic with augmentin The rapid strep test is negative The covid test will be available in 2-3 days You can get your results on My Chart Rest Push fluids You must quarantine until the test results are all back Call for questions or problems     ED Prescriptions    Medication Sig Dispense Auth. Provider   amoxicillin-clavulanate (AUGMENTIN) 875-125 MG tablet Take 1 tablet by mouth every 12 (twelve) hours. 20 tablet Raylene Everts, MD     Controlled Substance Prescriptions [place] Controlled Substance Registry consulted? No   Raylene Everts, MD 01/09/19 2116

## 2019-01-12 ENCOUNTER — Telehealth (HOSPITAL_COMMUNITY): Payer: Self-pay | Admitting: Emergency Medicine

## 2019-01-12 LAB — CULTURE, GROUP A STREP (THRC)

## 2019-01-12 LAB — NOVEL CORONAVIRUS, NAA (HOSP ORDER, SEND-OUT TO REF LAB; TAT 18-24 HRS): SARS-CoV-2, NAA: DETECTED — AB

## 2019-01-12 NOTE — Telephone Encounter (Signed)
Your test for COVID-19 was positive, meaning that you were infected with the novel coronavirus and could give the germ to others.  Please continue isolation at home, for at least 10 days since the start of your fever/cough/breathlessness and until you have had 3 consecutive days without fever (without taking a fever reducer) and with cough/breathlessness improving. Please continue good preventive care measures, including:  frequent hand-washing, avoid touching your face, cover coughs/sneezes, stay out of crowds and keep a 6 foot distance from others.  Recheck or go to the nearest hospital ED tent for re-assessment if fever/cough/breathlessness return.  Patient contacted and made aware of    results, all questions answered   

## 2019-01-18 ENCOUNTER — Telehealth: Payer: 59 | Admitting: Family

## 2019-01-18 DIAGNOSIS — J029 Acute pharyngitis, unspecified: Secondary | ICD-10-CM

## 2019-01-18 NOTE — Progress Notes (Signed)
Based on what you shared with me, I feel your condition warrants further evaluation and I recommend that you be seen for a face to face office visit.  It looks like you were seen in the ED on 01/09/19 and diagnosed with an abscess. You need to be seen face to face to be evaluated. You may need an antibiotic shot and your antibiotic changed.    NOTE: If you entered your credit card information for this eVisit, you will not be charged. You may see a "hold" on your card for the $35 but that hold will drop off and you will not have a charge processed.  If you are having a true medical emergency please call 911.     For an urgent face to face visit, [facility] has four urgent care centers for your convenience:   . [facility]    [phone]                  Get Driving Directions  [address], [place] [address] . 10 am to 8 pm Monday-Friday . 12 pm to 8 pm Saturday-Sunday   . [facility] at [place]                  Get Driving Directions  [address], [place], [place] [address] . 8 am to 8 pm Monday-Friday . 9 am to 6 pm Saturday . 11 am to 6 pm Sunday   . [facility] at [place]                  Get Driving Directions   [address] [place], [place] [address] . 8 am to 8 pm Monday-Friday . 8 am to 4 pm Saturday-Sunday    . [facility] at [place]                    Get Driving Directions  [phone]  [address] [place], [place] [address]  . Monday-Friday, 12 PM to 6 PM    Your e-visit answers were reviewed by a board certified advanced clinical practitioner to complete your personal care plan.  Thank you for using e-Visits.

## 2019-01-26 DIAGNOSIS — J3501 Chronic tonsillitis: Secondary | ICD-10-CM | POA: Diagnosis not present

## 2019-01-30 ENCOUNTER — Other Ambulatory Visit: Payer: Self-pay

## 2019-01-30 ENCOUNTER — Encounter (HOSPITAL_BASED_OUTPATIENT_CLINIC_OR_DEPARTMENT_OTHER): Payer: Self-pay | Admitting: *Deleted

## 2019-01-31 ENCOUNTER — Other Ambulatory Visit (HOSPITAL_COMMUNITY)
Admission: RE | Admit: 2019-01-31 | Discharge: 2019-01-31 | Disposition: A | Discharge status: Home / Self Care | Payer: 59 | Source: Ambulatory Visit | Attending: Otolaryngology | Admitting: Otolaryngology

## 2019-01-31 ENCOUNTER — Ambulatory Visit: Payer: Self-pay | Admitting: Otolaryngology

## 2019-01-31 NOTE — Progress Notes (Signed)
Left voicemail for patient reminding her of her covid appointment that was scheduled for 0940 this am. Advised we do close at 1515 today, and to call back to reschedule if she will not be coming today.

## 2019-01-31 NOTE — H&P (Signed)
PREOPERATIVE H&P  Chief Complaint: Recurrent tonsillitis  HPI: Traci Livingston is a 33 y.o. female who presents for evaluation of recurrent tonsillitis and tonsillar hypertrophy. Patient has history of recurrent positive strep tests in the past. She has always had large tonsils and frequent sore throats. She is otherwise healthy. She is taken to the operative room at this time for a T&A.  Past Medical History:  Diagnosis Date  . CHF (congestive heart failure) ([place])    "fluid around my heart"   History reviewed. No pertinent surgical history. Social History   Socioeconomic History  . Marital status: Single    Spouse name: Not on file  . Number of children: Not on file  . Years of education: Not on file  . Highest education level: Not on file  Occupational History  . Not on file  Social Needs  . Financial resource strain: Not on file  . Food insecurity    Worry: Not on file    Inability: Not on file  . Transportation needs    Medical: Not on file    Non-medical: Not on file  Tobacco Use  . Smoking status: Former Smoker    Packs/day: 1.00    Types: Cigars    Quit date: 2011    Years since quitting: 9.7  . Smokeless tobacco: Never Used  Substance and Sexual Activity  . Alcohol use: Yes    Comment: occassionally  . Drug use: No  . Sexual activity: Yes    Birth control/protection: None  Lifestyle  . Physical activity    Days per week: Not on file    Minutes per session: Not on file  . Stress: Not on file  Relationships  . Social Herbalist on phone: Not on file    Gets together: Not on file    Attends religious service: Not on file    Active member of club or organization: Not on file    Attends meetings of clubs or organizations: Not on file    Relationship status: Not on file  Other Topics Concern  . Not on file  Social History Narrative  . Not on file   History reviewed. No pertinent family history. No Known Allergies Prior to Admission  medications   Medication Sig Start Date End Date Taking? Authorizing Provider  amoxicillin-clavulanate (AUGMENTIN) 875-125 MG tablet Take 1 tablet by mouth every 12 (twelve) hours. 01/09/19  Yes Raylene Everts, MD  ibuprofen (ADVIL,MOTRIN) 200 MG tablet Take 400-600 mg by mouth every 6 (six) hours as needed. For pain   Yes [provider]     Positive ROS: negative  All other systems have been reviewed and were otherwise negative with the exception of those mentioned in the HPI and as above.  Physical Exam: There were no vitals filed for this visit.  General: Alert, no acute distress Oral: Normal oral mucosa and 3+ tonsils bilaterally with a little bit of white exudate. Nasal: Clear nasal passages Neck: No palpable adenopathy or thyroid nodules Ear: Ear canal is clear with normal appearing TMs Cardiovascular: Regular rate and rhythm, no murmur.  Respiratory: Clear to auscultation Neurologic: Alert and oriented x 3   Assessment/Plan: HYPERTROPHY OF ADENOIDS AND TONSILS Plan for Procedure(s): TONSILLECTOMY AND ADENOIDECTOMY   Melony Overly, MD 01/31/2019 5:22 PM

## 2019-02-03 ENCOUNTER — Encounter (HOSPITAL_BASED_OUTPATIENT_CLINIC_OR_DEPARTMENT_OTHER)
Admission: RE | Disposition: A | Discharge status: Home / Self Care | Payer: Self-pay | Source: Ambulatory Visit | Attending: Otolaryngology

## 2019-02-03 ENCOUNTER — Encounter (HOSPITAL_BASED_OUTPATIENT_CLINIC_OR_DEPARTMENT_OTHER): Payer: Self-pay

## 2019-02-03 ENCOUNTER — Ambulatory Visit (HOSPITAL_BASED_OUTPATIENT_CLINIC_OR_DEPARTMENT_OTHER): Payer: 59 | Admitting: Anesthesiology

## 2019-02-03 ENCOUNTER — Other Ambulatory Visit: Payer: Self-pay

## 2019-02-03 ENCOUNTER — Ambulatory Visit (HOSPITAL_BASED_OUTPATIENT_CLINIC_OR_DEPARTMENT_OTHER)
Admission: RE | Admit: 2019-02-03 | Discharge: 2019-02-03 | Disposition: A | Discharge status: Home / Self Care | Payer: 59 | Source: Ambulatory Visit | Attending: Otolaryngology | Admitting: Otolaryngology

## 2019-02-03 DIAGNOSIS — F17200 Nicotine dependence, unspecified, uncomplicated: Secondary | ICD-10-CM

## 2019-02-03 DIAGNOSIS — Z87891 Personal history of nicotine dependence: Secondary | ICD-10-CM | POA: Insufficient documentation

## 2019-02-03 DIAGNOSIS — G4730 Sleep apnea, unspecified: Secondary | ICD-10-CM | POA: Diagnosis not present

## 2019-02-03 DIAGNOSIS — J351 Hypertrophy of tonsils: Secondary | ICD-10-CM | POA: Diagnosis not present

## 2019-02-03 DIAGNOSIS — J3501 Chronic tonsillitis: Secondary | ICD-10-CM | POA: Diagnosis not present

## 2019-02-03 DIAGNOSIS — Z8619 Personal history of other infectious and parasitic diseases: Secondary | ICD-10-CM | POA: Diagnosis not present

## 2019-02-03 DIAGNOSIS — Z6841 Body Mass Index (BMI) 40.0 and over, adult: Secondary | ICD-10-CM | POA: Insufficient documentation

## 2019-02-03 DIAGNOSIS — J0391 Acute recurrent tonsillitis, unspecified: Secondary | ICD-10-CM | POA: Diagnosis not present

## 2019-02-03 DIAGNOSIS — I509 Heart failure, unspecified: Secondary | ICD-10-CM | POA: Diagnosis not present

## 2019-02-03 DIAGNOSIS — D649 Anemia, unspecified: Secondary | ICD-10-CM | POA: Diagnosis not present

## 2019-02-03 HISTORY — PX: TONSILLECTOMY AND ADENOIDECTOMY: SHX28

## 2019-02-03 LAB — POCT PREGNANCY, URINE: Preg Test, Ur: NEGATIVE

## 2019-02-03 SURGERY — TONSILLECTOMY AND ADENOIDECTOMY
Anesthesia: General | Site: Mouth | Laterality: Bilateral

## 2019-02-03 MED ORDER — CEFAZOLIN SODIUM-DEXTROSE 2-4 GM/100ML-% IV SOLN
2.0000 g | INTRAVENOUS | Status: AC
  Administered 2019-02-03: 10:00:00 2 g via INTRAVENOUS

## 2019-02-03 MED ORDER — MEPERIDINE HCL 25 MG/ML IJ SOLN: 6.2500 mg | INTRAMUSCULAR | Status: DC | PRN

## 2019-02-03 MED ORDER — DEXAMETHASONE SODIUM PHOSPHATE 10 MG/ML IJ SOLN
INTRAMUSCULAR | Status: AC
  Filled 2019-02-03: qty 1

## 2019-02-03 MED ORDER — LIDOCAINE 2% (20 MG/ML) 5 ML SYRINGE
INTRAMUSCULAR | Status: DC | PRN
  Administered 2019-02-03: 100 mg via INTRAVENOUS

## 2019-02-03 MED ORDER — FENTANYL CITRATE (PF) 100 MCG/2ML IJ SOLN
INTRAMUSCULAR | Status: AC
  Filled 2019-02-03: qty 2

## 2019-02-03 MED ORDER — DEXAMETHASONE SODIUM PHOSPHATE 10 MG/ML IJ SOLN
INTRAMUSCULAR | Status: DC | PRN
  Administered 2019-02-03: 10 mg via INTRAVENOUS

## 2019-02-03 MED ORDER — HYDROMORPHONE HCL 1 MG/ML IJ SOLN
INTRAMUSCULAR | Status: AC
  Filled 2019-02-03: qty 0.5

## 2019-02-03 MED ORDER — LACTATED RINGERS IV SOLN
INTRAVENOUS | Status: DC
  Administered 2019-02-03: 09:00:00 via INTRAVENOUS

## 2019-02-03 MED ORDER — OXYCODONE HCL 5 MG PO TABS
5.0000 mg | ORAL_TABLET | Freq: Once | ORAL | Status: AC
  Administered 2019-02-03: 13:00:00 5 mg via ORAL

## 2019-02-03 MED ORDER — CHLORHEXIDINE GLUCONATE CLOTH 2 % EX PADS: 6.0000 | MEDICATED_PAD | Freq: Once | CUTANEOUS | Status: DC

## 2019-02-03 MED ORDER — ROCURONIUM BROMIDE 50 MG/5ML IV SOSY
PREFILLED_SYRINGE | INTRAVENOUS | Status: DC | PRN
  Administered 2019-02-03: 50 mg via INTRAVENOUS
  Administered 2019-02-03: 10 mg via INTRAVENOUS

## 2019-02-03 MED ORDER — PROPOFOL 10 MG/ML IV BOLUS
INTRAVENOUS | Status: AC
  Filled 2019-02-03: qty 20

## 2019-02-03 MED ORDER — PROMETHAZINE HCL 25 MG/ML IJ SOLN: 6.2500 mg | INTRAMUSCULAR | Status: DC | PRN

## 2019-02-03 MED ORDER — ONDANSETRON HCL 4 MG/2ML IJ SOLN
INTRAMUSCULAR | Status: AC
  Filled 2019-02-03: qty 2

## 2019-02-03 MED ORDER — ACETAMINOPHEN 500 MG PO TABS
ORAL_TABLET | ORAL | Status: AC
  Filled 2019-02-03: qty 2

## 2019-02-03 MED ORDER — MIDAZOLAM HCL 2 MG/2ML IJ SOLN: 0.5000 mg | Freq: Once | INTRAMUSCULAR | Status: DC | PRN

## 2019-02-03 MED ORDER — CEFAZOLIN SODIUM-DEXTROSE 1-4 GM/50ML-% IV SOLN
INTRAVENOUS | Status: AC
  Filled 2019-02-03: qty 50

## 2019-02-03 MED ORDER — MIDAZOLAM HCL 2 MG/2ML IJ SOLN
INTRAMUSCULAR | Status: AC
  Filled 2019-02-03: qty 2

## 2019-02-03 MED ORDER — FENTANYL CITRATE (PF) 100 MCG/2ML IJ SOLN
50.0000 ug | INTRAMUSCULAR | Status: DC | PRN
  Administered 2019-02-03: 10:00:00 100 ug via INTRAVENOUS

## 2019-02-03 MED ORDER — LIDOCAINE 2% (20 MG/ML) 5 ML SYRINGE
INTRAMUSCULAR | Status: AC
  Filled 2019-02-03: qty 5

## 2019-02-03 MED ORDER — MIDAZOLAM HCL 2 MG/2ML IJ SOLN
1.0000 mg | INTRAMUSCULAR | Status: DC | PRN
  Administered 2019-02-03: 2 mg via INTRAVENOUS

## 2019-02-03 MED ORDER — PROPOFOL 10 MG/ML IV BOLUS
INTRAVENOUS | Status: DC | PRN
  Administered 2019-02-03: 200 mg via INTRAVENOUS

## 2019-02-03 MED ORDER — ONDANSETRON HCL 4 MG/2ML IJ SOLN
INTRAMUSCULAR | Status: DC | PRN
  Administered 2019-02-03: 4 mg via INTRAVENOUS

## 2019-02-03 MED ORDER — AMOXICILLIN 250 MG/5ML PO SUSR: 500.0000 mg | Freq: Two times a day (BID) | ORAL | 0 refills | Status: AC

## 2019-02-03 MED ORDER — SCOPOLAMINE 1 MG/3DAYS TD PT72
MEDICATED_PATCH | TRANSDERMAL | Status: AC
  Filled 2019-02-03: qty 1

## 2019-02-03 MED ORDER — SCOPOLAMINE 1 MG/3DAYS TD PT72
1.0000 | MEDICATED_PATCH | Freq: Once | TRANSDERMAL | Status: DC
  Administered 2019-02-03: 09:00:00 1.5 mg via TRANSDERMAL

## 2019-02-03 MED ORDER — HYDROMORPHONE HCL 1 MG/ML IJ SOLN
0.2500 mg | INTRAMUSCULAR | Status: DC | PRN
  Administered 2019-02-03 (×2): 0.5 mg via INTRAVENOUS

## 2019-02-03 MED ORDER — STERILE WATER FOR IRRIGATION IR SOLN
Status: DC | PRN
  Administered 2019-02-03: 1000 mL

## 2019-02-03 MED ORDER — ROCURONIUM BROMIDE 10 MG/ML (PF) SYRINGE
PREFILLED_SYRINGE | INTRAVENOUS | Status: AC
  Filled 2019-02-03: qty 10

## 2019-02-03 MED ORDER — 0.9 % SODIUM CHLORIDE (POUR BTL) OPTIME
TOPICAL | Status: DC | PRN
  Administered 2019-02-03: 10:00:00 1000 mL

## 2019-02-03 MED ORDER — SUGAMMADEX SODIUM 200 MG/2ML IV SOLN
INTRAVENOUS | Status: DC | PRN
  Administered 2019-02-03: 200 mg via INTRAVENOUS

## 2019-02-03 MED ORDER — HYDROCODONE-ACETAMINOPHEN 7.5-325 MG/15ML PO SOLN: 10.0000 mL | Freq: Four times a day (QID) | ORAL | 0 refills | Status: AC | PRN

## 2019-02-03 MED ORDER — CEFAZOLIN SODIUM-DEXTROSE 2-4 GM/100ML-% IV SOLN
INTRAVENOUS | Status: AC
  Filled 2019-02-03: qty 100

## 2019-02-03 MED ORDER — ACETAMINOPHEN 500 MG PO TABS
1000.0000 mg | ORAL_TABLET | Freq: Once | ORAL | Status: AC
  Administered 2019-02-03: 09:00:00 1000 mg via ORAL

## 2019-02-03 MED ORDER — OXYCODONE HCL 5 MG PO TABS
ORAL_TABLET | ORAL | Status: AC
  Filled 2019-02-03: qty 1

## 2019-02-03 SURGICAL SUPPLY — 31 items
BNDG COHESIVE 2X5 TAN STRL LF (GAUZE/BANDAGES/DRESSINGS) IMPLANT
CANISTER SUCT 1200ML W/VALVE (MISCELLANEOUS) ×3 IMPLANT
CATH ROBINSON RED A/P 12FR (CATHETERS) IMPLANT
CLEANER CAUTERY TIP 5X5 PAD (MISCELLANEOUS) IMPLANT
COAGULATOR SUCT 6 FR SWTCH (ELECTROSURGICAL) ×1
COAGULATOR SUCT SWTCH 10FR 6 (ELECTROSURGICAL) ×2 IMPLANT
COVER BACK TABLE REUSABLE LG (DRAPES) ×3 IMPLANT
COVER MAYO STAND REUSABLE (DRAPES) ×3 IMPLANT
COVER WAND RF STERILE (DRAPES) IMPLANT
DRAPE HALF SHEET 70X43 (DRAPES) ×3 IMPLANT
ELECT COATED BLADE 2.86 ST (ELECTRODE) ×3 IMPLANT
ELECT REM PT RETURN 9FT ADLT (ELECTROSURGICAL)
ELECT REM PT RETURN 9FT PED (ELECTROSURGICAL)
ELECTRODE REM PT RETRN 9FT PED (ELECTROSURGICAL) IMPLANT
ELECTRODE REM PT RTRN 9FT ADLT (ELECTROSURGICAL) IMPLANT
GAUZE SPONGE 4X4 12PLY STRL LF (GAUZE/BANDAGES/DRESSINGS) ×3 IMPLANT
GLOVE SS BIOGEL STRL SZ 7.5 (GLOVE) ×1 IMPLANT
GLOVE SUPERSENSE BIOGEL SZ 7.5 (GLOVE) ×2
GOWN STRL REUS W/ TWL LRG LVL3 (GOWN DISPOSABLE) ×1 IMPLANT
GOWN STRL REUS W/TWL LRG LVL3 (GOWN DISPOSABLE) ×3
MARKER SKIN DUAL TIP RULER LAB (MISCELLANEOUS) IMPLANT
NS IRRIG 1000ML POUR BTL (IV SOLUTION) ×3 IMPLANT
PAD CLEANER CAUTERY TIP 5X5 (MISCELLANEOUS) ×2
PENCIL FOOT CONTROL (ELECTRODE) ×3 IMPLANT
SOLUTION BUTLER CLEAR DIP (MISCELLANEOUS) ×3 IMPLANT
SPONGE TONSIL TAPE 1 RFD (DISPOSABLE) IMPLANT
SPONGE TONSIL TAPE 1.25 RFD (DISPOSABLE) IMPLANT
SYR BULB 3OZ (MISCELLANEOUS) ×3 IMPLANT
TOWEL GREEN STERILE FF (TOWEL DISPOSABLE) ×3 IMPLANT
TUBE CONNECTING 20'X1/4 (TUBING) ×1
TUBE CONNECTING 20X1/4 (TUBING) ×2 IMPLANT

## 2019-02-03 NOTE — Anesthesia Preprocedure Evaluation (Addendum)
Anesthesia Evaluation  Patient identified by MRN, date of birth, ID band Patient awake    Reviewed: Allergy & Precautions, NPO status , Patient's Chart, lab work & pertinent test results  History of Anesthesia Complications Negative for: history of anesthetic complications  Airway Mallampati: II  TM Distance: >3 FB Neck ROM: Full    Dental  (+) Dental Advisory Given   Pulmonary former smoker (quit 2011),  01/09/2019 SARS coronavirus POSITIVE   breath sounds clear to auscultation       Cardiovascular  Rhythm:Regular Rate:Normal  2011"possible pericarditis" but ECHO showed no abnormalities: EF 60-65%, valves normal, pericardium normal with no effusion    Neuro/Psych negative neurological ROS     GI/Hepatic negative GI ROS, Neg liver ROS,   Endo/Other  Morbid obesity  Renal/GU negative Renal ROS     Musculoskeletal   Abdominal (+) + obese,   Peds  Hematology negative hematology ROS (+)   Anesthesia Other Findings   Reproductive/Obstetrics LMP a week ago                            Anesthesia Physical Anesthesia Plan  ASA: II  Anesthesia Plan: General   Post-op Pain Management:    Induction: Intravenous  PONV Risk Score and Plan: 3 and Scopolamine patch - Pre-op, Dexamethasone and Ondansetron  Airway Management Planned: Oral ETT  Additional Equipment:   Intra-op Plan:   Post-operative Plan:   Informed Consent: I have reviewed the patients History and Physical, chart, labs and discussed the procedure including the risks, benefits and alternatives for the proposed anesthesia with the patient or authorized representative who has indicated his/her understanding and acceptance.     Dental advisory given  Plan Discussed with: CRNA and Surgeon  Anesthesia Plan Comments:        Anesthesia Quick Evaluation

## 2019-02-03 NOTE — Transfer of Care (Signed)
Immediate Anesthesia Transfer of Care Note  Patient: Traci Livingston  Procedure(s) Performed: TONSILLECTOMY AND ADENOIDECTOMY (Bilateral Mouth)  Patient Location: PACU  Anesthesia Type:General  Level of Consciousness: awake, alert , oriented and patient cooperative  Airway & Oxygen Therapy: Patient Spontanous Breathing and Patient connected to face mask oxygen  Post-op Assessment: Report given to RN and Post -op Vital signs reviewed and stable  Post vital signs: Reviewed and stable  Last Vitals:  Vitals Value Taken Time  BP    Temp    Pulse    Resp    SpO2      Last Pain:  Vitals:   02/03/19 0846  TempSrc: Oral  PainSc: 0-No pain         Complications: No apparent anesthesia complications

## 2019-02-03 NOTE — Anesthesia Procedure Notes (Signed)
Procedure Name: Intubation Performed by: Laretta Alstrom, CRNA Pre-anesthesia Checklist: Patient identified, Emergency Drugs available, Suction available, Patient being monitored and Timeout performed Patient Re-evaluated:Patient Re-evaluated prior to induction Oxygen Delivery Method: Circle system utilized Preoxygenation: Pre-oxygenation with 100% oxygen Induction Type: IV induction Ventilation: Mask ventilation without difficulty Laryngoscope Size: Mac and 4 Grade View: Grade I Tube size: 7.0 mm Number of attempts: 1 Airway Equipment and Method: Stylet Placement Confirmation: ETT inserted through vocal cords under direct vision,  positive ETCO2,  CO2 detector and breath sounds checked- equal and bilateral Secured at: 22 cm Tube secured with: Tape Dental Injury: Teeth and Oropharynx as per pre-operative assessment

## 2019-02-03 NOTE — Op Note (Signed)
NAME: Traci Livingston, Traci Livingston MEDICAL RECORD OV:[id number] ACCOUNT [id number] DATE OF BIRTH:1985/11/23 FACILITY: [facility] LOCATION: [facility] PHYSICIAN:Dillinger Aston Lincoln Maxin, MD  OPERATIVE REPORT  DATE OF PROCEDURE:  02/03/2019  PREOPERATIVE DIAGNOSIS:  History of recurrent tonsillitis with significant tonsillar hypertrophy.  POSTOPERATIVE DIAGNOSIS:  History of recurrent tonsillitis with significant tonsillar hypertrophy.  OPERATION PERFORMED:  Tonsillectomy.  SURGEON:  Melony Overly, MD  ANESTHESIA:  General endotracheal.  COMPLICATIONS:  None.  ESTIMATED BLOOD LOSS:  70 mL.  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  She has had several previous positive strep tests.  She has had recurrent tonsil infections and on exam, she has always had large tonsils and has 3+ tonsils  bilaterally, basically touching the uvula.  She is taken to the operating room at this time for tonsillectomy and possible adenoidectomy.  She denies any nasal obstruction.  DESCRIPTION OF PROCEDURE:  After adequate endotracheal anesthesia, a mouth gag was used to expose the oropharynx.  The patient had very large, almost kissing tonsils bilaterally.  First, the left tonsil was resected from the tonsil fossa using the  cautery.  Care was taken to preserve the anterior and posterior tonsillar pillars as well as the uvula.  Hemostasis was obtained with cautery.  Next, the right tonsil was removed.  Again, cautery was used to remove the tonsil.  Of note, on the right  side, the patient had prominent bleeding vessel superiorly as well as a second bleeding vessel inferiorly that both were cauterized using suction cautery.  After removing the tonsils, the tonsillar beds were examined and irrigated with saline.   Additional cautery had to be performed in the right tonsillar bed until it was perfectly dry.  Following this, a red rubber catheter was passed through the nose and out the  mouth to retract the soft palate.  The nasopharynx was examined.  She had only  moderate adenoid tissue and nothing was done in the adenoid region.  Of note, on reexamination of the tonsillar fossa, both were dry, but she had a very elongated very edematous uvula and the lower one half of the uvula was amputated with cautery.  This  completed the procedure.  The patient was subsequently awoken from anesthesia and transferred to recovery room postoperatively doing well.  DISPOSITION:  The patient will be discharged home later this morning on amoxicillin suspension 500 mg b.i.d. for [redacted] week along with Tylenol, ibuprofen and hydrocodone elixir 10-15 mL q.6 hours p.r.n. pain.  She will follow up in my office in 2 weeks for  recheck.  TN/NUANCE  D:02/03/2019 T:02/03/2019 JOB:[id number]

## 2019-02-03 NOTE — Interval H&P Note (Signed)
History and Physical Interval Note:  02/03/2019 9:37 AM  Traci Livingston  has presented today for surgery, with the diagnosis of HYPERTROPHY OF ADENOIDS AND TONSILS.  The various methods of treatment have been discussed with the patient and family. After consideration of risks, benefits and other options for treatment, the patient has consented to  Procedure(s): TONSILLECTOMY AND ADENOIDECTOMY (Bilateral) as a surgical intervention.  The patient's history has been reviewed, patient examined, no change in status, stable for surgery.  I have reviewed the patient's chart and labs.  Questions were answered to the patient's satisfaction.     Melony Overly

## 2019-02-03 NOTE — Discharge Instructions (Addendum)
[facility] Living[facility] M.D[facility] Postoperative Instructions for Tonsillectomy & Adenoidectomy (T&A) Activity Restrict activity at home for the first two days, resting as much as possible. Light indoor activity is best. You may usually return to school or work within a week but void strenuous activity and sports for two weeks. Sleep with your head elevated on 2-3 pillows for 3-4 days to help decrease swelling. Diet Due to tissue swelling and throat discomfort, you may have little desire to drink for several days. However fluids are very important to prevent dehydration. You will find that non-acidic juices, soups, popsicles, Jell-O, custard, puddings, and any soft or mashed foods taken in small quantities can be swallowed fairly easily. Try to increase your fluid and food intake as the discomfort subsides. It is recommended that a child receive 1-1/2 quarts of fluid in a 24-hour period. Adult require twice this amount.  Discomfort Your sore throat may be relieved by applying an ice collar to your neck and/or by taking Tylenol. You may experience an earache, which is due to referred pain from the throat. Referred ear pain is commonly felt at night when trying to rest.  Bleeding: Although rare, there is risk of having some bleeding during the first 2 weeks after having a T&A. This usually happens between days 7-10 postoperatively. If you or your child should have any bleeding, try to remain calm. We recommend sitting up quietly in a chair and gently spitting out the blood into a bowl. For adults, gargling gently with ice water may help. If the bleeding does not stop after a short time (5 minutes), is more than 1 teaspoonful, or if you become worried, please call our office at [phone] or go directly to the nearest hospital emergency room. Do not eat or drink anything prior to going to the hospital as you may need to be taken to the operating room in order to control the bleeding. GENERAL  CONSIDERATIONS 1. Brush your teeth regularly. Avoid mouthwashes and gargles for three weeks. You may gargle gently with warm salt-water as necessary or spray with Chloraseptic. You may make salt-water by placing 2 teaspoons of table salt into a quart of fresh water. Warm the salt-water in a microwave to a luke warm temperature.  2. Avoid exposure to colds and upper respiratory infections if possible.  3. If you look into a mirror or into your child's mouth, you will see white-gray patches in the back of the throat. This is normal after having a T&A and is like a scab that forms on the skin after an abrasion. It will disappear once the back of the throat heals completely. However, it may cause a noticeable odor; this too will disappear with time. Again, warm salt-water gargles may be used to help keep the throat clean and promote healing.  4. You may notice a temporary change in voice quality, such as a higher pitched voice or a nasal sound, until healing is complete. This may last for 1-2 weeks and should resolve.  5. Do not take or give you child any medications that we have not prescribed or recommended.  6. Snoring may occur, especially at night, for the first week after a T&A. It is due to swelling of the soft palate and will usually resolve.  Please call our office at [phone] if you have any questions.  Instructions for Home Care After Tonsillectomy  First Day Home: Encourage fluid intake by frequently offering liquids, soup, ice cream jello, etc.  Drink several  glasses of water.  Cooler fluids are best.  Avoid hot and highly seasoned foods.  Orange juice, grapefruit juice and tomato juice may cause stinging sensation because of their acidic content.    Second and Third Day Home: Continue liquids and add soft foods, (pudding, macaroni and cheese, mashed potatoes, soft scrambled eggs, etc.).  Make sure you drink plenty of liquids so you do not get dehydrated.  Fifth Thru Seventh Day  Home: Gradually resume a normal diet, but avoid hot foods, potato chips, nuts, toast and crackers until 2 weeks after surgery.  General Instructions   No undue physical exertion or exercise for one week.  Children: Tylenol may be used for discomfort and/or fever.  Use as often as necessary within limits of the directions.  Adults: May spray throat with Chloroseptic or other topical anesthetic for discomfort and use pain medication obtained by prescription as directed.    A slight fever (up to 101) is expected for the first the first couple of days.  Take Tylenol (or aspirin substitute) as directed.  Pain in ears is common after tonsillectomy.  It represents pain referred from the throat where the tonsils were removed.  There is usually nothing wrong with the ears in most cases.  Administer Tylenol as needed to control this pain.  White patches will form where the tonsils were removed.  This is perfectly normal.  They will disappear in one to two weeks.  Mouth odor may be notice during the healing stage.  In a very small percentage of people, there is some bleeding after five to six days.  If this happens, do not become excited, for the bleeding is usually light.  Be quiet, lie down, and spit the blood out gently.  Gargle the throat with ice water.  If the bleeding does not stop promptly, call the office [phone]), which answers 24 hours a day.  A follow up appointment should be made with Dr. Ezzard StandingNewman 10-14 days following surgery. Please call [phone] for the appointment time.  Amoxicillin 500 mg twice per day for the next week Tylenol, ibuprofen or hydrocodone elixir 10-15 cc every 6 hrs prn pain   Next dose of Tylenol may be given at 3pm if needed  Post Anesthesia Home Care Instructions  Activity: Get plenty of rest for the remainder of the day. A responsible individual must stay with you for 24 hours following the procedure.  For the next 24 hours, DO NOT: -Drive a car -Social workerperate  machinery -Drink alcoholic beverages -Take any medication unless instructed by your physician -Make any legal decisions or sign important papers.  Meals: Start with liquid foods such as gelatin or soup. Progress to regular foods as tolerated. Avoid greasy, spicy, heavy foods. If nausea and/or vomiting occur, drink only clear liquids until the nausea and/or vomiting subsides. Call your physician if vomiting continues.  Special Instructions/Symptoms: Your throat may feel dry or sore from the anesthesia or the breathing tube placed in your throat during surgery. If this causes discomfort, gargle with warm salt water. The discomfort should disappear within 24 hours.  If you had a scopolamine patch placed behind your ear for the management of post- operative nausea and/or vomiting:  1. The medication in the patch is effective for 72 hours, after which it should be removed.  Wrap patch in a tissue and discard in the trash. Wash hands thoroughly with soap and water. 2. You may remove the patch earlier than 72 hours if you experience unpleasant side effects  which may include dry mouth, dizziness or visual disturbances. 3. Avoid touching the patch. Wash your hands with soap and water after contact with the patch.

## 2019-02-03 NOTE — Brief Op Note (Signed)
02/03/2019  10:41 AM  PATIENT:  Traci Livingston  33 y.o. female  PRE-OPERATIVE DIAGNOSIS:  HYPERTROPHY OF ADENOIDS AND TONSILS  POST-OPERATIVE DIAGNOSIS:  HYPERTROPHY OF ADENOIDS AND TONSILS  PROCEDURE:  Procedure(s): TONSILLECTOMY AND ADENOIDECTOMY (Bilateral)  SURGEON:  Surgeon(s) and Role:    Rozetta Nunnery, MD - Primary  PHYSICIAN ASSISTANT:   ASSISTANTS: none   ANESTHESIA:   general  EBL:  75 mL   BLOOD ADMINISTERED:none  DRAINS: none   LOCAL MEDICATIONS USED:  NONE  SPECIMEN:  No Specimen  DISPOSITION OF SPECIMEN:  N/A  COUNTS:  YES  TOURNIQUET:  * No tourniquets in log *  DICTATION: .Other Dictation: Dictation Number [id number]  PLAN OF CARE: Discharge to home after PACU  PATIENT DISPOSITION:  PACU - hemodynamically stable.   Delay start of Pharmacological VTE agent (>24hrs) due to surgical blood loss or risk of bleeding: yes

## 2019-02-03 NOTE — Anesthesia Postprocedure Evaluation (Signed)
Anesthesia Post Note  Patient: Traci Livingston  Procedure(s) Performed: TONSILLECTOMY AND ADENOIDECTOMY (Bilateral Mouth)     Patient location during evaluation: PACU Anesthesia Type: General Level of consciousness: awake and alert, patient cooperative and oriented Pain management: pain level controlled Vital Signs Assessment: post-procedure vital signs reviewed and stable Respiratory status: spontaneous breathing, nonlabored ventilation and respiratory function stable Cardiovascular status: blood pressure returned to baseline and stable Postop Assessment: no apparent nausea or vomiting Anesthetic complications: no    Last Vitals:  Vitals:   02/03/19 1239 02/03/19 1302  BP:  129/77  Pulse: 66 67  Resp: (!) 22 16  Temp:  36.8 C  SpO2: 97% 98%    Last Pain:  Vitals:   02/03/19 1302  TempSrc:   PainSc: 5                  Dontez Hauss,E. Ulises Wolfinger

## 2019-02-06 ENCOUNTER — Encounter (HOSPITAL_BASED_OUTPATIENT_CLINIC_OR_DEPARTMENT_OTHER): Payer: Self-pay | Admitting: Otolaryngology

## 2022-11-04 ENCOUNTER — Other Ambulatory Visit (HOSPITAL_BASED_OUTPATIENT_CLINIC_OR_DEPARTMENT_OTHER): Payer: Self-pay

## 2022-11-04 DIAGNOSIS — G4730 Sleep apnea, unspecified: Secondary | ICD-10-CM

## 2023-05-28 ENCOUNTER — Encounter (HOSPITAL_COMMUNITY): Payer: Self-pay

## 2023-05-28 ENCOUNTER — Emergency Department (HOSPITAL_COMMUNITY)
Admission: EM | Admit: 2023-05-28 | Discharge: 2023-05-28 | Disposition: A | Discharge status: Home / Self Care | Payer: Self-pay | Attending: Emergency Medicine | Admitting: Emergency Medicine

## 2023-05-28 ENCOUNTER — Emergency Department (HOSPITAL_COMMUNITY): Payer: Self-pay

## 2023-05-28 ENCOUNTER — Other Ambulatory Visit: Payer: Self-pay

## 2023-05-28 DIAGNOSIS — Z1152 Encounter for screening for COVID-19: Secondary | ICD-10-CM | POA: Insufficient documentation

## 2023-05-28 DIAGNOSIS — K5792 Diverticulitis of intestine, part unspecified, without perforation or abscess without bleeding: Secondary | ICD-10-CM | POA: Insufficient documentation

## 2023-05-28 DIAGNOSIS — R350 Frequency of micturition: Secondary | ICD-10-CM | POA: Insufficient documentation

## 2023-05-28 DIAGNOSIS — I509 Heart failure, unspecified: Secondary | ICD-10-CM | POA: Insufficient documentation

## 2023-05-28 DIAGNOSIS — D72829 Elevated white blood cell count, unspecified: Secondary | ICD-10-CM | POA: Insufficient documentation

## 2023-05-28 LAB — COMPREHENSIVE METABOLIC PANEL
ALT: 25 U/L (ref 0–44)
AST: 21 U/L (ref 15–41)
Albumin: 3.5 g/dL (ref 3.5–5.0)
Alkaline Phosphatase: 86 U/L (ref 38–126)
Anion gap: 12 (ref 5–15)
BUN: 9 mg/dL (ref 6–20)
CO2: 21 mmol/L — ABNORMAL LOW (ref 22–32)
Calcium: 9.4 mg/dL (ref 8.9–10.3)
Chloride: 103 mmol/L (ref 98–111)
Creatinine, Ser: 1.03 mg/dL — ABNORMAL HIGH (ref 0.44–1.00)
GFR, Estimated: >60 mL/min (ref >60)
Glucose, Bld: 115 mg/dL — ABNORMAL HIGH (ref 70–99)
Potassium: 3.9 mmol/L (ref 3.5–5.1)
Sodium: 136 mmol/L (ref 135–145)
Total Bilirubin: 0.6 mg/dL (ref 0.0–1.2)
Total Protein: 7.7 g/dL (ref 6.5–8.1)

## 2023-05-28 LAB — CBC
HCT: 41.3 % (ref 36.0–46.0)
Hemoglobin: 13.3 g/dL (ref 12.0–15.0)
MCH: 26.5 pg (ref 26.0–34.0)
MCHC: 32.2 g/dL (ref 30.0–36.0)
MCV: 82.3 fL (ref 80.0–100.0)
Platelets: 333 10*3/uL (ref 150–400)
RBC: 5.02 MIL/uL (ref 3.87–5.11)
RDW: 13.6 % (ref 11.5–15.5)
WBC: 16.5 10*3/uL — ABNORMAL HIGH (ref 4.0–10.5)
nRBC: 0 % (ref 0.0–0.2)

## 2023-05-28 LAB — URINALYSIS, ROUTINE W REFLEX MICROSCOPIC
Bilirubin Urine: NEGATIVE
Glucose, UA: NEGATIVE mg/dL
Hgb urine dipstick: NEGATIVE
Ketones, ur: NEGATIVE mg/dL
Nitrite: NEGATIVE
Protein, ur: NEGATIVE mg/dL
Specific Gravity, Urine: 1.019 (ref 1.005–1.030)
pH: 6 (ref 5.0–8.0)

## 2023-05-28 LAB — RESP PANEL BY RT-PCR (RSV, FLU A&B, COVID)  RVPGX2
Influenza A by PCR: NEGATIVE
Influenza B by PCR: NEGATIVE
Resp Syncytial Virus by PCR: NEGATIVE
SARS Coronavirus 2 by RT PCR: NEGATIVE

## 2023-05-28 LAB — LIPASE, BLOOD: Lipase: 22 U/L (ref 11–51)

## 2023-05-28 LAB — HCG, SERUM, QUALITATIVE: Preg, Serum: NEGATIVE

## 2023-05-28 LAB — I-STAT CG4 LACTIC ACID, ED
Lactic Acid, Venous: 1.4 mmol/L (ref 0.5–1.9)
Lactic Acid, Venous: 1.5 mmol/L (ref 0.5–1.9)

## 2023-05-28 MED ORDER — SODIUM CHLORIDE 0.9 % IV SOLN
2.0000 g | Freq: Once | INTRAVENOUS | Status: AC
  Administered 2023-05-28: 2 g via INTRAVENOUS
  Filled 2023-05-28: qty 20

## 2023-05-28 MED ORDER — ACETAMINOPHEN 500 MG PO TABS
1000.0000 mg | ORAL_TABLET | Freq: Once | ORAL | Status: AC
  Administered 2023-05-28: 1000 mg via ORAL
  Filled 2023-05-28: qty 2

## 2023-05-28 MED ORDER — SODIUM CHLORIDE 0.9 % IV BOLUS
1000.0000 mL | Freq: Once | INTRAVENOUS | Status: AC
  Administered 2023-05-28: 1000 mL via INTRAVENOUS

## 2023-05-28 MED ORDER — KETOROLAC TROMETHAMINE 15 MG/ML IJ SOLN
15.0000 mg | Freq: Once | INTRAMUSCULAR | Status: AC
  Administered 2023-05-28: 15 mg via INTRAVENOUS
  Filled 2023-05-28: qty 1

## 2023-05-28 MED ORDER — AMOXICILLIN-POT CLAVULANATE 875-125 MG PO TABS: 1.0000 | ORAL_TABLET | Freq: Two times a day (BID) | ORAL | 0 refills | Status: AC

## 2023-05-28 MED ORDER — IOHEXOL 350 MG/ML SOLN
75.0000 mL | Freq: Once | INTRAVENOUS | Status: AC | PRN
  Administered 2023-05-28: 75 mL via INTRAVENOUS

## 2023-05-28 NOTE — ED Provider Triage Note (Signed)
Emergency Medicine Provider Triage Evaluation Note  Traci Livingston , a 38 y.o. female  was evaluated in triage.  Pt complains of lower abdominal pain radiating to back associated with intermittent nausea.  Reports some loose stool and fevers and chills.    Review of Systems  Positive: Fevers, chills, abdominal pain Negative: Chest pain, shortness of breath, cough  Physical Exam  BP (!) 150/81 (BP Location: Right Arm)   Pulse (!) 101   Temp 100.1 F (37.8 C)   Resp 20   Ht 5\' 9"  (1.753 m)   Wt (!) 149.7 kg   SpO2 98%   BMI 48.73 kg/m  Gen:   Awake, no distress   Resp:  Normal effort  MSK:   Moves extremities without difficulty  Other:  Generalized abdominal pain, abdomen is soft and nondistended.  Medical Decision Making  Medically screening exam initiated at 5:01 PM.  Appropriate orders placed.  Traci Livingston was informed that the remainder of the evaluation will be completed by another provider, this initial triage assessment does not replace that evaluation, and the importance of remaining in the ED until their evaluation is complete.  Febrile and tachycardic will initiate sepsis workup   Smitty Knudsen, PA-C 05/28/23 1704

## 2023-05-28 NOTE — Discharge Instructions (Addendum)
You were seen in the emergency department for abdominal pain and fever.  Test performed while you are here include blood work, urine studies, and CT scan.  These tests show that you have diverticulitis.  Your urine was possibly infected we have sent a urine culture to our lab.  We are treating you with an antibiotic called Augmentin.    You can take Tylenol and ibuprofen as needed for pain and fever.  Please follow-up with your PCP in the next 3-5 days for reevaluation.  If you experience significantly increased abdominal pain, inability keep food or drink down, inability urinate, or any other concerns, return to the ED for reevaluation.  Traci Livingston:  Thank you for allowing [place] to take care of you today.  We hope you begin feeling better soon.  To-Do: Please follow-up with your primary doctor or call [phone] to schedule an appointment with a new primary care doctor Please return to the Emergency Department or call 911 if you experience chest pain, shortness of breath, severe pain, severe fever, altered mental status, or have any reason to think that you need emergency medical care.  Thank you again.  Hope you feel better soon.  Department of Emergency Medicine [facility]

## 2023-05-28 NOTE — Sepsis Progress Note (Signed)
Elink monitoring for the code sepsis protocol.  

## 2023-05-28 NOTE — ED Provider Notes (Signed)
[place] EMERGENCY DEPARTMENT AT [facility] Provider Note  MDM   HPI/ROS:  Traci Livingston is a 38 y.o. female with pertinent past medical history of obesity who presents abdominal pain.  Patient reports gradual onset of primarily lower abdominal pain yesterday evening that has progressively worsened since that time.  She additionally reports fevers up to 102F.  She initially was having some constipation with last normal bowel movement 2 days ago, though did have some loose stool PTA.  She additionally has been having some urinary frequency without dysuria, hematuria.  She denies any chest pain, dyspnea, cough, congestion, vaginal bleeding or vaginal discharge.  Last menstrual period ended about 3 days ago.   Physical exam is notable for: - Diffuse mild abdominal tenderness palpation most severe in the left mid abdomen and epigastric regions --Dry mucous membranes  On my initial evaluation, patient is:  -Vital signs stable. Patient initially afebrile, subsequently noted to have fever of 102.3 with mild tachycardia meeting 2/4 SIRS criteria. Otherwise normotensive, hemodynamically stable, and non-toxic appearing. -Additional history obtained from patient's girlfriend -Labs reviewed: WBC 16.5, hemoglobin 13.3, creatinine 1.03, CO2 21, AG 12, normal electrolytes, lactic acid 1.5, lipase 22 -UA with small leuks and few bacteria, no blood -Urine pregnancy negative -COVID/flu/RSV negative  Based on this patient's current presentation, including their history and physical exam, differential diagnoses include UTI/pyelonephritis, diverticulitis, nephrolithiasis, gastroenteritis, gastritis, appendicitis, less likely cholelithiasis.  Labs as above with leukocytosis, mild nongap metabolic acidosis, otherwise unremarkable. UA not overly concerning for infection, though urine culture pending.  CT abdomen/pelvis was subsequently obtained and demonstrated acute uncomplicated diverticulitis.  Repeat  temperature with interval resolution of fever and tachycardia.  She received IV fluids and Rocephin while in the emergency department and was subsequently discharged home on Augmentin.  Patient remained stable and had no acute events while in my care in the emergency department.   Disposition:  I discussed the plan for discharge with the patient and/or their surrogate at bedside prior to discharge and they were in agreement with the plan and verbalized understanding of the return precautions provided. All questions answered to the best of my ability. Ultimately, the patient was discharged in stable condition with stable vital signs. I am reassured that they are capable of close follow up and good social support at home.   Clinical Impression:  1. Diverticulitis     Rx / DC Orders ED Discharge Orders          Ordered    amoxicillin-clavulanate (AUGMENTIN) 875-125 MG tablet  Every 12 hours        05/28/23 2009            The plan for this patient was discussed with Dr. Silverio Lay, who voiced agreement and who oversaw evaluation and treatment of this patient.   Clinical Complexity A medically appropriate history, review of systems, and physical exam was performed.  My independent interpretations of EKG, labs, and radiology are documented in the ED course above.   If decision rules were used in this patient's evaluation, they are listed below.   Patient's presentation is most consistent with acute presentation with potential threat to life or bodily function.  Medical Decision Making Amount and/or Complexity of Data Reviewed Labs: ordered.  Risk Prescription drug management.    HPI/ROS      See MDM section for pertinent HPI and ROS. A complete ROS was performed with pertinent positives/negatives noted above.   Past Medical History:  Diagnosis Date   CHF (  congestive heart failure) (HCC)    "fluid around my heart"    Past Surgical History:  Procedure Laterality Date    TONSILLECTOMY AND ADENOIDECTOMY Bilateral 02/03/2019   Procedure: TONSILLECTOMY AND ADENOIDECTOMY;  Surgeon: Drema Halon, MD;  Location: [facility];  Service: ENT;  Laterality: Bilateral;      Physical Exam   Vitals:   05/28/23 1616 05/28/23 1707 05/28/23 2003 05/28/23 2005  BP:  135/87  (!) 113/101  Pulse:  (!) 103  79  Resp:    (!) 26  Temp:  (!) 102.3 F (39.1 C) 98.7 F (37.1 C)   TempSrc:   Oral   SpO2:  99%  100%  Weight: (!) 149.7 kg     Height: 5\' 9"  (1.753 m)       Physical Exam Gen: NAD. Appears comfortable HENT: Conjunctiva clear, PERRL, EOMI. mildly dry mucous membranes.  CV: RRR. No M/R/G Pulm: Lungs CTAB with no wheezing, rales, or rhonchi.  GI: Abdomen soft, nondistended, diffuse mild abdominal tenderness to palpation most severe in the left mid abdomen and epigastric regions. Normal bowel sounds in all 4 quadrants.  No CVA tenderness bilaterally MSK/Skin: No lower extremity edema. Extremities warm, well-perfused with 2+ pulses in all 4 extremities. Neuro: A&Ox3. GCS 15. Moves all extremities.     Procedures   If procedures were preformed on this patient, they are listed below:  Procedures   Mikeal Hawthorne, MD Emergency Medicine PGY-2   Please note that this documentation was produced with the assistance of voice-to-text technology and may contain errors.    Mikeal Hawthorne, MD 05/29/23 Traci Livingston    Charlynne Pander, MD 06/02/23 [phone]

## 2023-05-28 NOTE — ED Notes (Signed)
Patient transported to CT 

## 2023-05-28 NOTE — ED Triage Notes (Signed)
Bilateral lower abdominal pain with urgency, denies any dsyuria  Pain radiates into her middles of her back. Pain is dull ache with periods of stabbing.  Pt. Has not been able to mover her bowels in 2 days.  Did have a little today very hard had to strain no blood noted.  Denies any vaginal symptoms and denies any n/v.

## 2023-05-30 LAB — URINE CULTURE

## 2023-06-02 LAB — CULTURE, BLOOD (ROUTINE X 2)
Culture: NO GROWTH
Culture: NO GROWTH
Special Requests: ADEQUATE
